# Patient Record
Sex: Female | Born: 1975 | Race: Black or African American | Hispanic: No | Marital: Single | Smoking: Never smoker
Health system: Southern US, Community
[De-identification: ages and names within clinical notes are randomized; demographics above are authoritative.]

## PROBLEM LIST (undated history)

## (undated) DIAGNOSIS — I10 Essential (primary) hypertension: Secondary | ICD-10-CM

---

## 2004-05-09 ENCOUNTER — Ambulatory Visit: Payer: Self-pay | Admitting: Internal Medicine

## 2004-12-06 ENCOUNTER — Emergency Department: Payer: Self-pay | Admitting: Internal Medicine

## 2004-12-07 ENCOUNTER — Ambulatory Visit: Payer: Self-pay | Admitting: Internal Medicine

## 2004-12-19 ENCOUNTER — Emergency Department: Payer: Self-pay | Admitting: Emergency Medicine

## 2007-08-16 IMAGING — US US OB COMP ADD'L GEST LESS 14 WKS
1 series · 17 of 28 positions shown · non-contrast
Comparison: none

REASON FOR EXAM: Pregnant with Pain  [facility] [address]
COMMENTS:

[Series 1: us ob comp add'left gest less 14 wks · 17 of 30 slices shown]
[im 1/30]
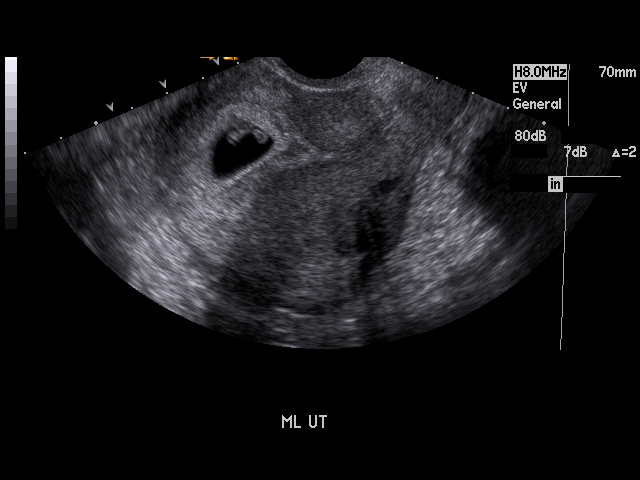
[im 3/30]
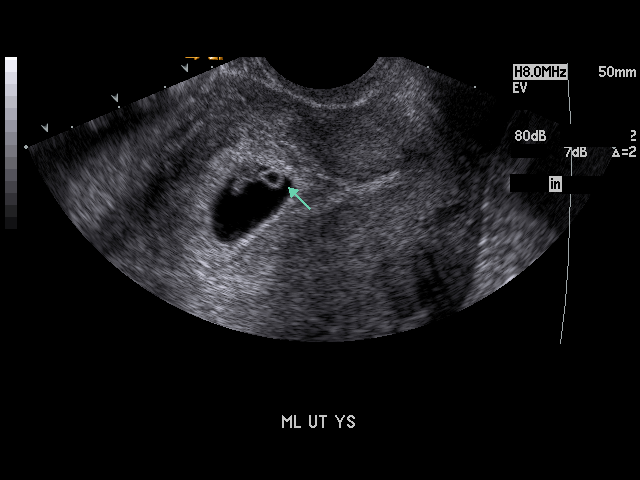
[im 5/30]
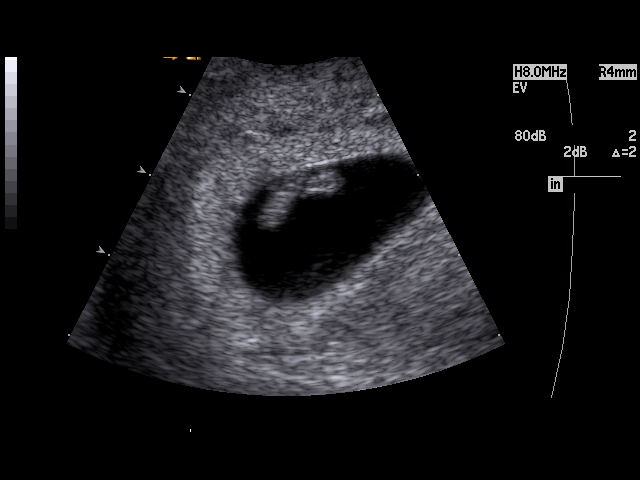
[im 6/30]
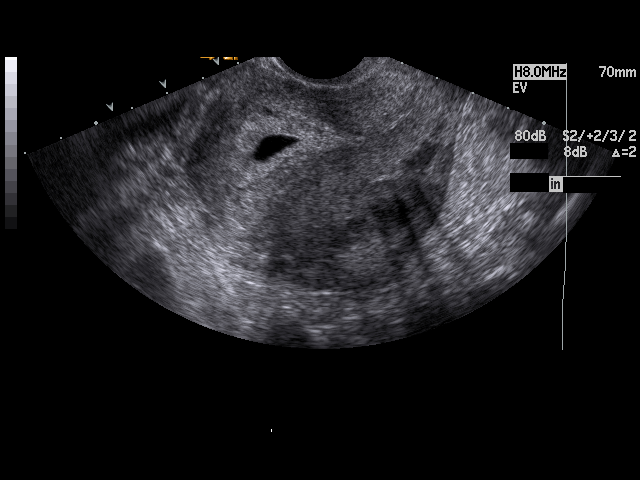
[im 8/30]
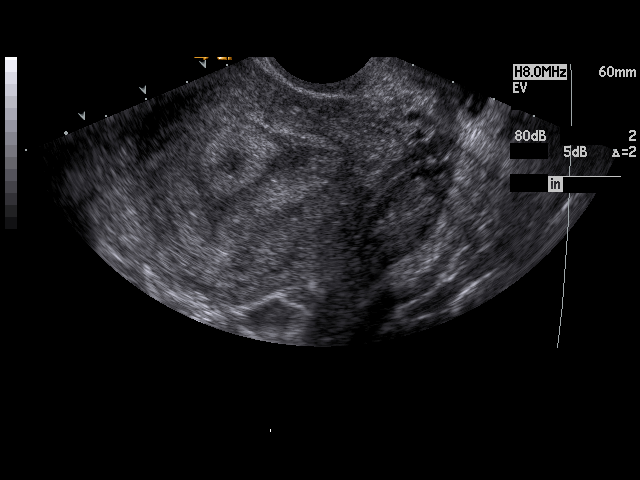
[im 10/30]
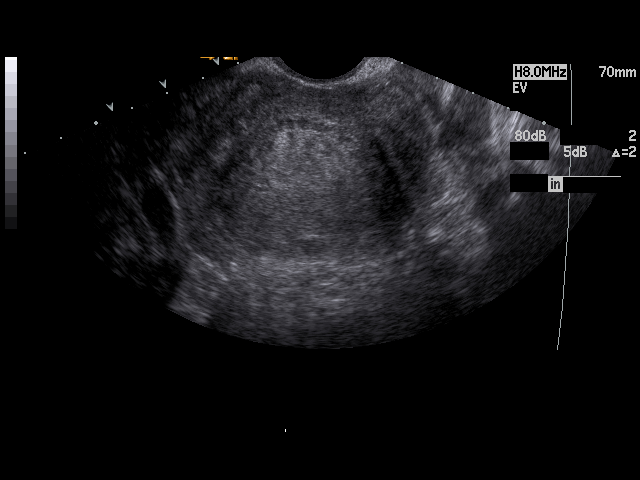
[im 11/30]
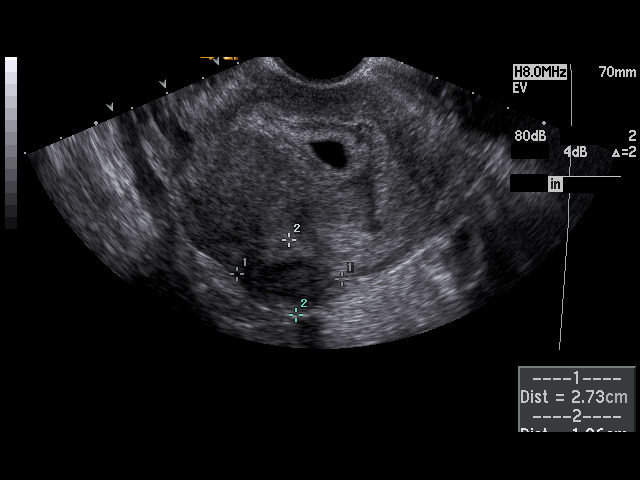
[im 13/30]
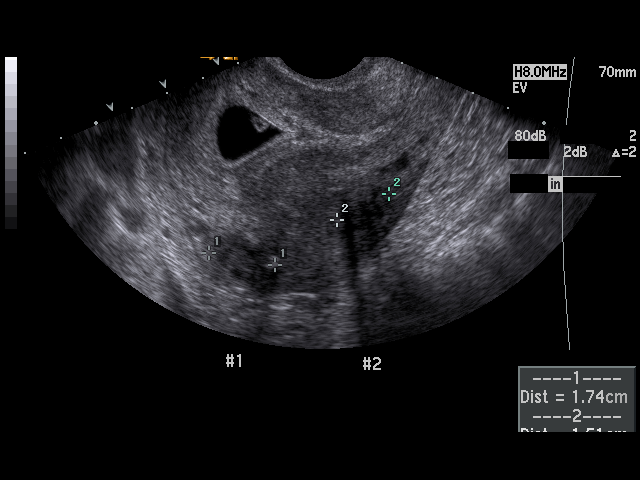
[im 16/30]
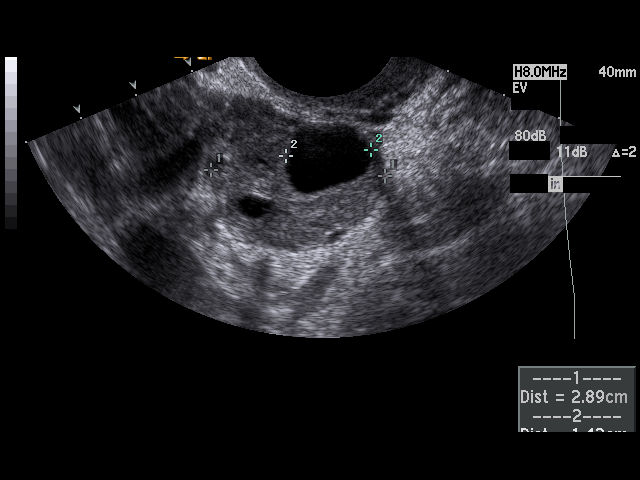
[im 17/30]
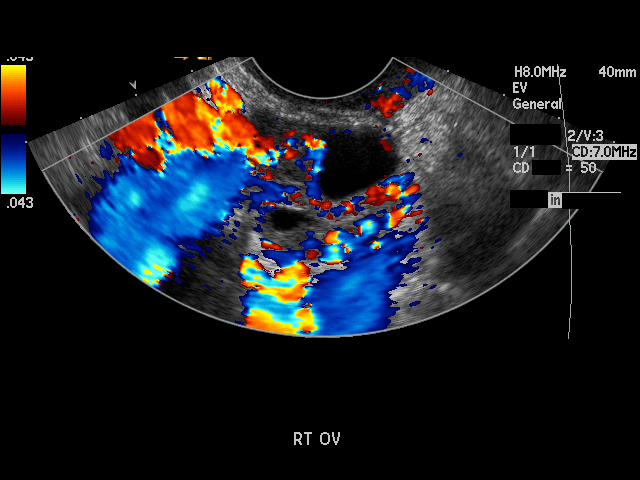
[im 19/30]
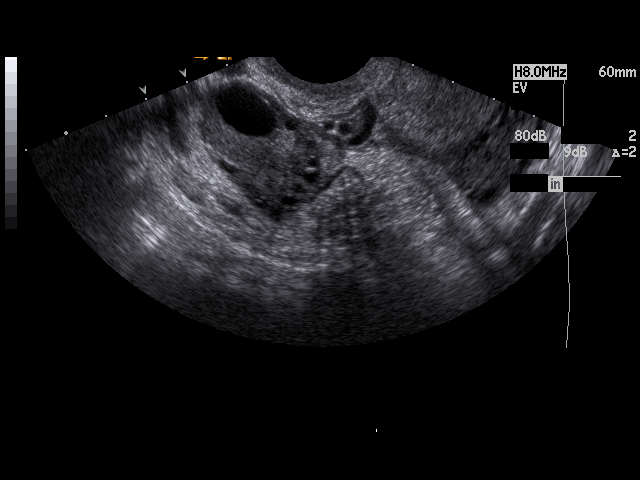
[im 20/30]
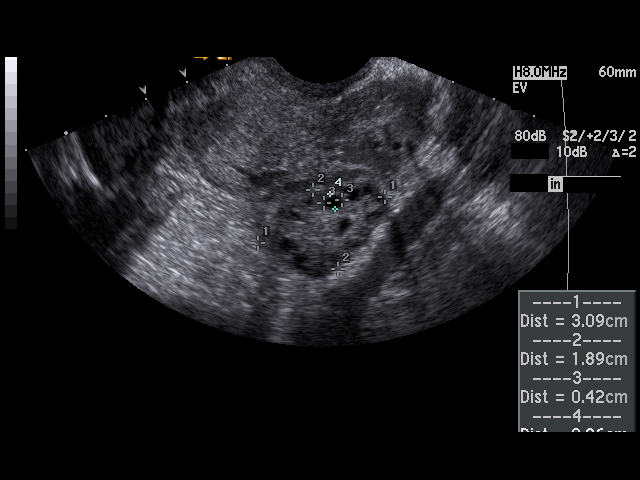
[im 22/30]
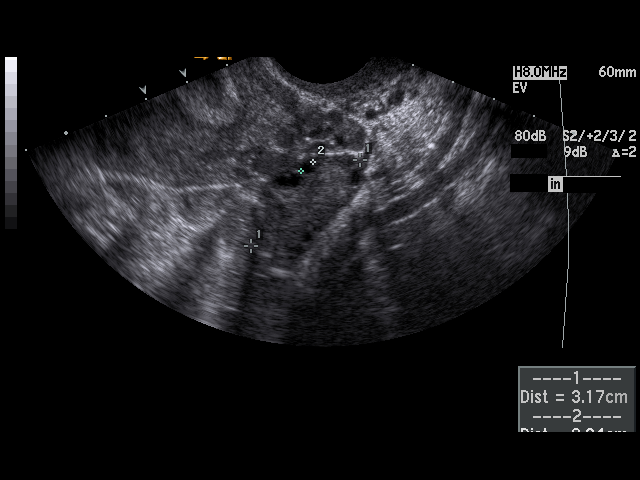
[im 24/30]
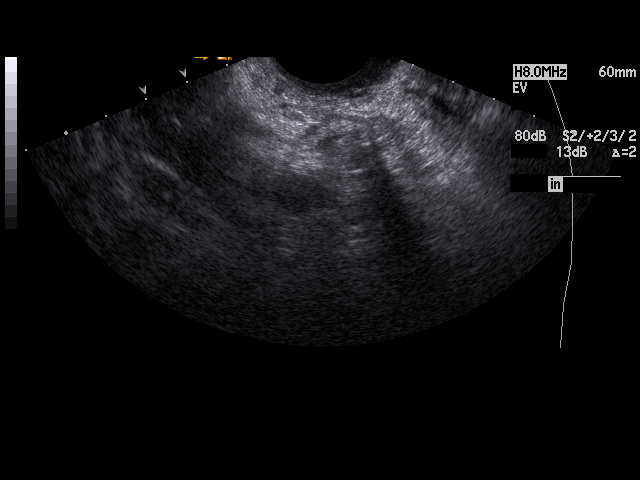
[im 25/30]
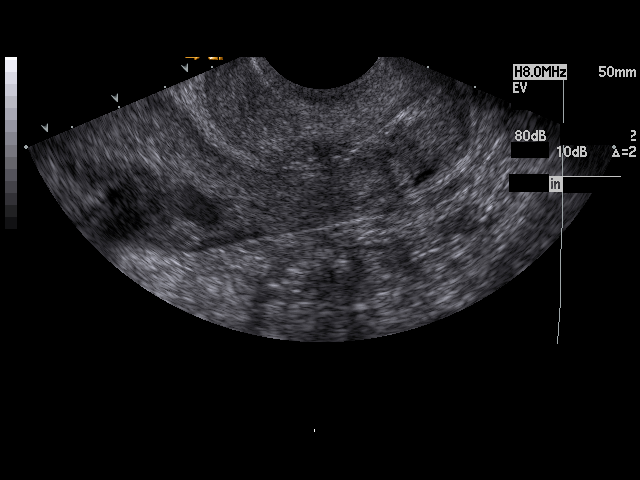
[im 27/30]
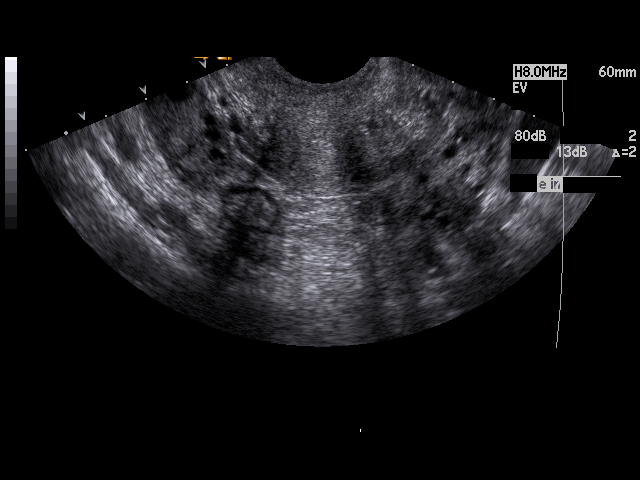
[im 30/30]
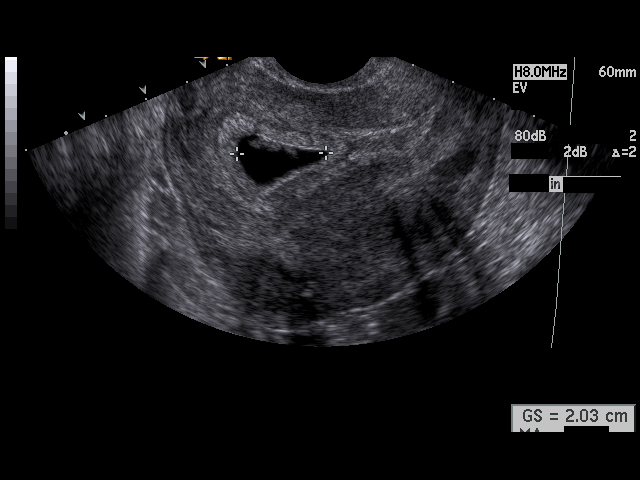

[17 of 28 positions shown; findings below may reference images not displayed]

PROCEDURE:     US  - US OB EA ADD GEST LESS THAN 14WK  - December 07, 2004  [DATE]

RESULT:     There is observed a viable intrauterine gestation. Embryo heart
rate was monitored at 131 beats per minute.  A yolk sac is visualized.
There are noted two hypoechoic areas in the uterus consistent with uterine
fibroids.  These are located posteriorly in the uterus. The larger measures
2.73 cm at maximum diameter and the smaller measures 1.85 cm at maximum
diameter. The RIGHT and LEFT ovaries are visualized.  The RIGHT ovary
measures 3.94 cm at maximum diameter and the LEFT ovary measures 3.17 cm at
maximum diameter. A few follicular cysts are seen in each ovary. In the
RIGHT ovary there is a 1.43 cm simple cyst. No ascites is seen.

The crown rump length measures .67 cm, which corresponds to an estimated
gestational age of 6 weeks-5 days.  The ultrasound EDD is July 29, 2005.
IMPRESSION: 1)Viable intrauterine gestation of approximately 6 weeks-5 days gestational
age.

2)There are two hypoechoic masses posteriorly in the uterus consistent with
uterine fibroids as noted above.

## 2014-10-14 ENCOUNTER — Encounter: Payer: Self-pay | Admitting: Emergency Medicine

## 2014-10-14 ENCOUNTER — Emergency Department
Admission: EM | Admit: 2014-10-14 | Discharge: 2014-10-14 | Disposition: A | Discharge status: Home / Self Care | Attending: Emergency Medicine | Admitting: Emergency Medicine

## 2014-10-14 ENCOUNTER — Emergency Department

## 2014-10-14 DIAGNOSIS — S0083XA Contusion of other part of head, initial encounter: Secondary | ICD-10-CM | POA: Diagnosis not present

## 2014-10-14 DIAGNOSIS — S0990XA Unspecified injury of head, initial encounter: Secondary | ICD-10-CM | POA: Diagnosis present

## 2014-10-14 DIAGNOSIS — Y92410 Unspecified street and highway as the place of occurrence of the external cause: Secondary | ICD-10-CM | POA: Insufficient documentation

## 2014-10-14 DIAGNOSIS — I10 Essential (primary) hypertension: Secondary | ICD-10-CM | POA: Diagnosis not present

## 2014-10-14 DIAGNOSIS — S0081XA Abrasion of other part of head, initial encounter: Secondary | ICD-10-CM | POA: Diagnosis not present

## 2014-10-14 DIAGNOSIS — Z3202 Encounter for pregnancy test, result negative: Secondary | ICD-10-CM | POA: Diagnosis not present

## 2014-10-14 DIAGNOSIS — Y9389 Activity, other specified: Secondary | ICD-10-CM | POA: Insufficient documentation

## 2014-10-14 DIAGNOSIS — Y998 Other external cause status: Secondary | ICD-10-CM | POA: Insufficient documentation

## 2014-10-14 DIAGNOSIS — Z23 Encounter for immunization: Secondary | ICD-10-CM | POA: Diagnosis not present

## 2014-10-14 HISTORY — DX: Essential (primary) hypertension: I10

## 2014-10-14 LAB — POCT PREGNANCY, URINE: Preg Test, Ur: NEGATIVE

## 2014-10-14 MED ORDER — TETANUS-DIPHTH-ACELL PERTUSSIS 5-2.5-18.5 LF-MCG/0.5 IM SUSP
0.5000 mL | Freq: Once | INTRAMUSCULAR | Status: AC
  Administered 2014-10-14: 0.5 mL via INTRAMUSCULAR
  Filled 2014-10-14: qty 0.5

## 2014-10-14 MED ORDER — HYDROCODONE-ACETAMINOPHEN 5-325 MG PO TABS: 1.0000 | ORAL_TABLET | ORAL | Status: AC | PRN
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
## 2014-10-14 NOTE — Discharge Instructions (Signed)
WOUND CARE °Please return in 0 days to have your °stitches/staples removed or sooner if you have concerns. °• Keep area clean and dry for 24 hours. Do not remove °bandage, if applied. °• After 24 hours, remove bandage and wash wound °gently with mild soap and warm water. Reapply °a new bandage after cleaning wound, if directed. °• Continue daily cleansing with soap and water until °stitches/staples are removed. °• Do not apply any ointments or creams to the wound °while stitches/staples are in place, as this may cause °delayed healing. °• Notify the office if you experience any of the following °signs of infection: Swelling, redness, pus drainage, °streaking, fever >101.0 F °• Notify the office if you experience excessive bleeding °that does not stop after 15-20 minutes of constant, firm °pressure. ° ° ° °

## 2014-10-14 NOTE — ED Notes (Signed)
Contacted workers comp representative Gardiner Coins @ [phone]).  Instructed that they do not need a urine drug screen or blood alcohol level tested unless the provider deemed it necessary.
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
## 2014-10-19 ENCOUNTER — Encounter: Payer: Self-pay | Admitting: Emergency Medicine

## 2014-10-19 ENCOUNTER — Emergency Department
Admission: EM | Admit: 2014-10-19 | Discharge: 2014-10-19 | Disposition: A | Discharge status: Home / Self Care | Payer: Self-pay | Attending: Emergency Medicine | Admitting: Emergency Medicine

## 2014-10-19 DIAGNOSIS — I10 Essential (primary) hypertension: Secondary | ICD-10-CM | POA: Insufficient documentation

## 2014-10-19 DIAGNOSIS — R519 Headache, unspecified: Secondary | ICD-10-CM

## 2014-10-19 DIAGNOSIS — R51 Headache: Secondary | ICD-10-CM | POA: Insufficient documentation

## 2014-10-19 MED ORDER — KETOROLAC TROMETHAMINE 60 MG/2ML IM SOLN
60.0000 mg | Freq: Once | INTRAMUSCULAR | Status: AC
  Administered 2014-10-19: 60 mg via INTRAMUSCULAR
  Filled 2014-10-19: qty 2

## 2014-10-19 MED ORDER — BUTALBITAL-APAP-CAFFEINE 50-325-40 MG PO TABS: 1.0000 | ORAL_TABLET | Freq: Four times a day (QID) | ORAL | Status: AC | PRN
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
## 2014-10-19 NOTE — ED Notes (Signed)

## 2014-12-13 ENCOUNTER — Emergency Department (HOSPITAL_COMMUNITY)
Admission: EM | Admit: 2014-12-13 | Discharge: 2014-12-13 | Disposition: A | Discharge status: Home / Self Care | Payer: Self-pay | Attending: Emergency Medicine | Admitting: Emergency Medicine

## 2014-12-13 ENCOUNTER — Encounter (HOSPITAL_COMMUNITY): Payer: Self-pay | Admitting: *Deleted

## 2014-12-13 DIAGNOSIS — M6740 Ganglion, unspecified site: Secondary | ICD-10-CM

## 2014-12-13 DIAGNOSIS — I10 Essential (primary) hypertension: Secondary | ICD-10-CM | POA: Insufficient documentation

## 2014-12-13 DIAGNOSIS — M67442 Ganglion, left hand: Secondary | ICD-10-CM | POA: Insufficient documentation

## 2014-12-13 MED ORDER — TRAMADOL HCL 50 MG PO TABS: 50.0000 mg | ORAL_TABLET | Freq: Four times a day (QID) | ORAL | Status: AC | PRN

## 2014-12-13 NOTE — ED Notes (Signed)
Care manager contacted for info on [facility] and wellness.

## 2014-12-13 NOTE — ED Notes (Signed)
Declined W/C at D/C and was escorted to lobby by RN. 

## 2014-12-13 NOTE — ED Provider Notes (Signed)
CSN: [id number]     Arrival date & time 12/13/14  [id number] History  By signing my name below, I, Elon SpannerGarrett Cook, attest that this documentation has been prepared under the direction and in the presence of Sharilyn SitesLisa Nigeria Lasseter, PA-C. Electronically Signed: Elon SpannerGarrett Cook ED Scribe. 12/13/2014. 9:39 AM.    Chief Complaint  Patient presents with  . Wrist Pain   The history is provided by the patient. No language interpreter was used.   HPI Comments: Francis GainesBecky L Gibbard is a 39 y.o. right-handed female with no significant hx who presents to the Emergency Department complaining of a worsening knot on the lateral left wrist onset 6 days ago without injury; no aggravating/alleviating factors.  The complaint is intermittently, minimally painful.    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  She reports a hx of HTN but takes no medication.  Patient hypertensive on arrival.  Past Medical History  Diagnosis Date  . Hypertension    History reviewed. No pertinent past surgical history. History reviewed. No pertinent family history. Social History  Substance Use Topics  . Smoking status: Never Smoker   . Smokeless tobacco: None  . Alcohol Use: No   OB History    No data available     Review of Systems A complete 10 system review of systems was obtained and all systems are negative except as noted in the HPI and PMH.   Allergies  Review of patient's allergies indicates no known allergies.  Home Medications   Prior to Admission medications   Medication Sig Start Date End Date Taking? Authorizing Provider  butalbital-acetaminophen-caffeine (FIORICET) [phone] [id number] MG per tablet Take 1-2 tablets by mouth every 6 (six) hours as needed for headache. 10/19/14 10/19/15  Joni Reiningonald K Smith, PA-C  HYDROcodone-acetaminophen (NORCO/VICODIN) 5-325 MG per tablet Take 1 tablet by mouth every 4 (four) hours as needed for moderate pain. 10/14/14   Tommi Rumpshonda L Summers, PA-C   BP 168/116  mmHg  Pulse 70  Temp(Src) 98.6 F (37 C) (Oral)  Resp 16  Ht 5\' 4"  (1.626 m)  Wt 190 lb (86.183 kg)  BMI 32.60 kg/m2  SpO2 100%  LMP 11/09/2014   Physical Exam  Constitutional: She is oriented to person, place, and time. She appears well-developed and well-nourished.  HENT:  Head: Normocephalic and atraumatic.  Mouth/Throat: Oropharynx is clear and moist.  Eyes: Conjunctivae and EOM are normal. Pupils are equal, round, and reactive to light.  Neck: Normal range of motion.  Cardiovascular: Normal rate, regular rhythm and normal heart sounds.   Pulmonary/Chest: Effort normal and breath sounds normal. No respiratory distress. She has no wheezes.  Abdominal: Soft. Bowel sounds are normal.  Musculoskeletal: Normal range of motion.  Apparent ganglion cyst along radial aspect of left dorsal wrist; overall non-tender; no drainage; no skin changes or signs of infection; full ROM of left wrist and all fingers; hand is NVI  Neurological: She is alert and oriented to person, place, and time.  Skin: Skin is warm and dry.  Psychiatric: She has a normal mood and affect.  Nursing note and vitals reviewed.   ED Course  ORTHOPEDIC INJURY TREATMENT Date/Time: 12/13/2014 9:57 AM Performed by: Garlon HatchetSANDERS, Rafay Dahan M Authorized by: Garlon HatchetSANDERS, Delissa Silba M Consent: Verbal consent obtained. Risks and benefits: risks, benefits and alternatives were discussed Consent given by: patient Patient understanding: patient states understanding of the procedure being performed Required items: required blood products, implants, devices, and special equipment available Patient identity confirmed: verbally  with patient Injury location: wrist Location details: left wrist Injury type: soft tissue Pre-procedure neurovascular assessment: neurovascularly intact Immobilization: splint Splint type: thumb spica Supplies used: aluminum splint Post-procedure neurovascular assessment: post-procedure neurovascularly intact Patient  tolerance: Patient tolerated the procedure well with no immediate complications   (including critical care time)  DIAGNOSTIC STUDIES: Oxygen Saturation is 100% on RA, normal by my interpretation.    COORDINATION OF CARE:  9:38 AM Will provide wrist splint and refer.  Patient advised to f/u with PCP for HTN.  Patient acknowledges and agrees with plan.    Labs Review Labs Reviewed - No data to display  Imaging Review No results found.    EKG Interpretation None      MDM   Final diagnoses:  Ganglion cyst   39 year old female here with "knot" on left wrist. This appears to be a ganglion cyst. There are no signs of superimposed infection or cellulitis. Patient maintains full range of motion of left wrist and hand is neurovascularly intact. Patient was placed in a wrist splint for comfort. Tramadol for pain. Recommended to follow-up with hand surgery if no improvement with conservative measures. Patient is also hypertensive. She has history of hypertension but does not take her medications. She has not seen a physician in several years. She has no chest pain or shortness of breath. No headache, dizziness, visual disturbance, numbness, or weakness. No clinical signs or symptoms concerning for end organ damage at this time. She will be referred to the wellness clinic for management of BP.  Discussed plan with patient, he/she acknowledged understanding and agreed with plan of care.  Return precautions given for new or worsening symptoms.  I personally performed the services described in this documentation, which was scribed in my presence. The recorded information has been reviewed and is accurate.  Garlon Hatchet, PA-C 12/13/14 4098  Geoffery Lyons, MD 12/13/14 1004

## 2014-12-13 NOTE — ED Notes (Signed)
PT reports raised area on LT lateral wrist started on Monday. Pain 7/10. Pt haS FULL RANGE OF MOTION TO lt WRIST.

## 2014-12-13 NOTE — Discharge Instructions (Signed)
Take the prescribed medication as directed if needed for pain. Otherwise may take Tylenol or Motrin. Wear wrist splint to help support wrist. Follow-up with hand specialist if symptoms continue or worsen. Please follow-up with your primary care physician in regards to your blood pressure as it is elevated today. Return to the ED for new or worsening symptoms.  Ganglion Cyst A ganglion cyst is a noncancerous, fluid-filled lump that occurs near joints or tendons. The ganglion cyst grows out of a joint or the lining of a tendon. It most often develops in the hand or wrist, but it can also develop in the shoulder, elbow, hip, knee, ankle, or foot. The round or oval ganglion cyst can be the size of a pea or larger than a grape. Increased activity may enlarge the size of the cyst because more fluid starts to build up.  CAUSES It is not known what causes a ganglion cyst to grow. However, it may be related to:  Inflammation or irritation around the joint.  An injury.  Repetitive movements or overuse.  Arthritis. RISK FACTORS Risk factors include:  Being a woman.  Being age 39-50. SIGNS AND SYMPTOMS Symptoms may include:   A lump. This most often appears on the hand or wrist, but it can occur in other areas of the body.  Tingling.  Pain.  Numbness.  Muscle weakness.  Weak grip.  Less movement in a joint. DIAGNOSIS Ganglion cysts are most often diagnosed based on a physical exam. Your health care provider will feel the lump and may shine a light alongside it. If it is a ganglion cyst, a light often shines through it. Your health care provider may order an X-ray, ultrasound, or MRI to rule out other conditions. TREATMENT Ganglion cysts usually go away on their own without treatment. If pain or other symptoms are involved, treatment may be needed. Treatment is also needed if the ganglion cyst limits your movement or if it gets infected. Treatment may include:  Wearing a brace or  splint on your wrist or finger.  Taking anti-inflammatory medicine.  Draining fluid from the lump with a needle (aspiration).  Injecting a steroid into the joint.  Surgery to remove the ganglion cyst. HOME CARE INSTRUCTIONS  Do not press on the ganglion cyst, poke it with a needle, or hit it.  Take medicines only as directed by your health care provider.  Wear your brace or splint as directed by your health care provider.  Watch your ganglion cyst for any changes.  Keep all follow-up visits as directed by your health care provider. This is important. SEEK MEDICAL CARE IF:  Your ganglion cyst becomes larger or more painful.  You have increased redness, red streaks, or swelling.  You have pus coming from the lump.  You have weakness or numbness in the affected area.  You have a fever or chills.   This information is not intended to replace advice given to you by your health care provider. Make sure you discuss any questions you have with your health care provider.   Document Released: 02/11/2000 Document Revised: 03/06/2014 Document Reviewed: 07/29/2013 Elsevier Interactive Patient Education [employer]

## 2016-07-18 ENCOUNTER — Encounter: Payer: Self-pay | Admitting: Emergency Medicine

## 2016-07-18 ENCOUNTER — Emergency Department
Admission: EM | Admit: 2016-07-18 | Discharge: 2016-07-18 | Disposition: A | Discharge status: Home / Self Care | Attending: Emergency Medicine | Admitting: Emergency Medicine

## 2016-07-18 DIAGNOSIS — Y9389 Activity, other specified: Secondary | ICD-10-CM | POA: Insufficient documentation

## 2016-07-18 DIAGNOSIS — Z23 Encounter for immunization: Secondary | ICD-10-CM | POA: Diagnosis not present

## 2016-07-18 DIAGNOSIS — S51851A Open bite of right forearm, initial encounter: Secondary | ICD-10-CM | POA: Diagnosis present

## 2016-07-18 DIAGNOSIS — Y990 Civilian activity done for income or pay: Secondary | ICD-10-CM | POA: Diagnosis not present

## 2016-07-18 DIAGNOSIS — W540XXA Bitten by dog, initial encounter: Secondary | ICD-10-CM | POA: Diagnosis not present

## 2016-07-18 DIAGNOSIS — I10 Essential (primary) hypertension: Secondary | ICD-10-CM | POA: Insufficient documentation

## 2016-07-18 DIAGNOSIS — Y929 Unspecified place or not applicable: Secondary | ICD-10-CM | POA: Insufficient documentation

## 2016-07-18 MED ORDER — TETANUS-DIPHTH-ACELL PERTUSSIS 5-2.5-18.5 LF-MCG/0.5 IM SUSP
0.5000 mL | Freq: Once | INTRAMUSCULAR | Status: AC
  Administered 2016-07-18: 0.5 mL via INTRAMUSCULAR
  Filled 2016-07-18: qty 0.5

## 2016-07-18 MED ORDER — AMOXICILLIN-POT CLAVULANATE 875-125 MG PO TABS: 1.0000 | ORAL_TABLET | Freq: Two times a day (BID) | ORAL | 0 refills | Status: AC

## 2016-07-18 NOTE — ED Notes (Signed)
[employer] has seen patient at this time, waiting for animal control to arrive.

## 2016-07-18 NOTE — ED Provider Notes (Signed)
[facility] Emergency Department Provider Note  ____________________________________________  Time seen: Approximately 5:02 PM  I have reviewed the triage vital signs and the nursing notes.   HISTORY  Chief Complaint Animal Bite    HPI Natasha Huber is a 41 y.o. female that presents to emergency department with dog bite to right forearm. 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 Owners state that the dog's vaccinations were updated in January. She is not allergic to any antibiotics. No additional injuries. She denies shortness breath, chest pain, nausea, vomiting, abdominal pain.   Past Medical History:  Diagnosis Date  . Hypertension     There are no active problems to display for this patient.   History reviewed. No pertinent surgical history.  Prior to Admission medications   Medication Sig Start Date End Date Taking? Authorizing Provider  amoxicillin-clavulanate (AUGMENTIN) 875-125 MG tablet Take 1 tablet by mouth 2 (two) times daily. 07/18/16 07/28/16  Enid DerryWagner, Elois Averitt, PA-C  HYDROcodone-acetaminophen (NORCO/VICODIN) 5-325 MG per tablet Take 1 tablet by mouth every 4 (four) hours as needed for moderate pain. 10/14/14   Tommi RumpsSummers, Rhonda L, PA-C  traMADol (ULTRAM) 50 MG tablet Take 1 tablet (50 mg total) by mouth every 6 (six) hours as needed. 12/13/14   Garlon HatchetSanders, Lisa M, PA-C    Allergies Patient has no known allergies.  No family history on file.  Social History Social History  Substance Use Topics  . Smoking status: Never Smoker  . Smokeless tobacco: Never Used  . Alcohol use No     Review of Systems  Constitutional: No fever/chills Cardiovascular: No chest pain. Respiratory:  No SOB. Gastrointestinal: No abdominal pain.  No nausea, no vomiting.  Skin: Negative for rash, ecchymosis. Positive for bite.   ____________________________________________   PHYSICAL EXAM:  VITAL SIGNS: ED  Triage Vitals  Enc Vitals Group     BP 07/18/16 1633 (!) 180/88     Pulse Rate 07/18/16 1633 (!) 102     Resp 07/18/16 1633 16     Temp 07/18/16 1633 98.5 F (36.9 C)     Temp Source 07/18/16 1633 Oral     SpO2 07/18/16 1633 99 %     Weight 07/18/16 1631 195 lb (88.5 kg)     Height 07/18/16 1631 5\' 4"  (1.626 m)     Head Circumference --      Peak Flow --      Pain Score 07/18/16 1631 5     Pain Loc --      Pain Edu? --      Excl. in GC? --      Constitutional: Alert and oriented. Well appearing and in no acute distress. Eyes: Conjunctivae are normal. PERRL. EOMI. Head: Atraumatic. ENT:      Ears:      Nose: No congestion/rhinnorhea.      Mouth/Throat: Mucous membranes are moist.  Neck: No stridor.  Cardiovascular: Normal rate, regular rhythm.  Good peripheral circulation. Respiratory: Normal respiratory effort without tachypnea or retractions. Lungs CTAB. Good air entry to the bases with no decreased or absent breath sounds. Musculoskeletal: Full range of motion to all extremities. No gross deformities appreciated. Neurologic:  Normal speech and language. No gross focal neurologic deficits are appreciated.  Skin:  Skin is warm, dry. No rash noted. 2mm laceration to right forearm.   ____________________________________________   LABS (all labs ordered are listed, but only abnormal results are displayed)  Labs  Reviewed - No data to display ____________________________________________  EKG   ____________________________________________  RADIOLOGY  No results found.  ____________________________________________    PROCEDURES  Procedure(s) performed:    Procedures    Medications  Tdap (BOOSTRIX) injection 0.5 mL (0.5 mLs Intramuscular Given 07/18/16 1800)     ____________________________________________   INITIAL IMPRESSION / ASSESSMENT AND PLAN / ED COURSE  Pertinent labs & imaging results that were available during my care of the patient were  reviewed by me and considered in my medical decision making (see chart for details).  Review of the [place] CSRS was performed in accordance of the [facility] prior to dispensing any controlled drugs.   Patient's diagnosis is consistent with dog bite. Vital signs and exam are reassuring. Dogs vaccinations are up-to-date. Tetanus shot was updated. Animal control and police were notified. Patient will be discharged home with prescriptions for Augmentin. Patient is to follow up with PCP as directed. Patient is given ED precautions to return to the ED for any worsening or new symptoms.     ____________________________________________  FINAL CLINICAL IMPRESSION(S) / ED DIAGNOSES  Final diagnoses:  Dog bite, initial encounter      NEW MEDICATIONS STARTED DURING THIS VISIT:  New Prescriptions   AMOXICILLIN-CLAVULANATE (AUGMENTIN) 875-125 MG TABLET    Take 1 tablet by mouth 2 (two) times daily.        This chart was dictated using voice recognition software/Dragon. Despite best efforts to proofread, errors can occur which can change the meaning. Any change was purely unintentional.    Enid Derry, PA-C 07/18/16 1832    Phineas Semen, MD 07/18/16 [phone]

## 2016-07-18 NOTE — ED Triage Notes (Signed)
Bitten by a dog right fore arm while working.  Works for [employer].  [address]
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
